# Patient Record
Sex: Female | Born: 1995 | Race: White | Hispanic: No | Marital: Single | Smoking: Never smoker
Health system: Southern US, Community
[De-identification: ages and names within clinical notes are randomized; demographics above are authoritative.]

## PROBLEM LIST (undated history)

## (undated) DIAGNOSIS — G43909 Migraine, unspecified, not intractable, without status migrainosus: Secondary | ICD-10-CM

## (undated) HISTORY — DX: Migraine, unspecified, not intractable, without status migrainosus: G43.909

## (undated) HISTORY — PX: WISDOM TOOTH EXTRACTION: SHX21

---
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
## 2014-02-07 ENCOUNTER — Encounter: Payer: Self-pay | Admitting: Neurology

## 2014-02-07 ENCOUNTER — Ambulatory Visit (INDEPENDENT_AMBULATORY_CARE_PROVIDER_SITE_OTHER): Payer: Managed Care, Other (non HMO) | Admitting: Neurology

## 2014-02-07 ENCOUNTER — Ambulatory Visit: Payer: Managed Care, Other (non HMO) | Admitting: Neurology

## 2014-02-07 VITALS — BP 100/70 | HR 76 | Ht 60.24 in | Wt 118.2 lb

## 2014-02-07 DIAGNOSIS — R42 Dizziness and giddiness: Secondary | ICD-10-CM

## 2014-02-07 DIAGNOSIS — R55 Syncope and collapse: Secondary | ICD-10-CM

## 2014-02-07 NOTE — Patient Instructions (Addendum)
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  Would likely benefit from a gently escalated physical activity program for graded reconditioning.  1.  Make all postural changes from lying to sitting or sitting to standing, slowly. 2.  Drink to 2.0 -2.5 L of fluids per day. 3.  Increase sodium in the diet to 3 - 5 g per day. 4.  Avoid large meals which can cause low blood pressure during digestion. It is better to eat smaller meals more often than three large meals. 5.  Avoid alcohol.  Alcohol and cause blood to pool in the legs which may worsen low blood pressure reactions when standing.  This can aggravate POTS. 6 .  Perform lower extremity exercises to improve strength of the leg muscles.  This will help prevent blood from a pooling in the legs when standing and walking. 7.  Raise the head of the bed by 6 to 10 inches.  The entire bed must be at an angle. Raising only the head portion of the bed at waist level or using pillows will not be effective.  Raising the head of the bed will reduce urine formation overnight and there will be more volume in the circulation in the morning. 8.  During bad days, drink 500 cc of water quickly.  This will result in an increased blood pressure within 5 minutes of drinking the water.  The effect will last up to one hour and may improve orthostatic intolerance. 9.  Use custom fitted elastic support stockings.  These will reduce a tendency for blood to pool in the legs when standing and may improve orthostatic intolerance. 10.  Use physical counter maneuvers such as leg crossing, squatting, or raising and resting the leg on a  chair.  These maneuvers increase blood pressure and can improve orthostatic intolerance.

## 2014-02-07 NOTE — Progress Notes (Signed)
Follow-up Visit   Date: 02/07/2014    Alton Revereaylor A Vallier MRN: [id number] DOB: 1996-01-28   Interim History: Alton Revereaylor A Birdwell is a 18 y.o. right-handed Caucasian female with history of migraines returning to the clinic for follow-up of lightheadedness.  The patient was accompanied to the clinic by mother who also provides collateral information.    History of present illness: She initially presented to the office on October 10, 2013 with abrupt change in vision.  She woke up feeling very lightheaded and and change is vision, described as seeing a small area with black around it ("tunnel vision"). It lasted about 10-15 seconds and since waking up today, it has occurred about 4-5 times always occuring with positional changes. She felt as if she could have passed out. She has had no further spells since being at the office. No associated headache, numbness/tingling, palpitations, excessive sweating, or weakness. She reports having a similar spell about 1 year ago.  She is very active in school Oncologist(cheerleader, sports, dance). She had not been working out more than usual and stays well-hydrated.   She reports having migraines which started in 4th grade and stopped a few years ago.   - Follow-up 02/07/2014: She continues to have dizziness and imbalance spells occuring when she gets up in the morning which lasts an hour and then resolves.  She has tunnel vision which lasts about 10 seconds.  She denies any twitching movement, confusion, or weakness.  She reports falling on one occasion without loss of consciousness.    Medications:  No current outpatient prescriptions on file prior to visit.   No current facility-administered medications on file prior to visit.    Allergies:  Allergies  Allergen Reactions  . Cephalosporins      Review of Systems:  CONSTITUTIONAL: No fevers, chills, night sweats, or weight loss.   EYES: No visual changes or eye pain ENT: No hearing changes.  No history of  nose bleeds.   RESPIRATORY: No cough, wheezing and shortness of breath.   CARDIOVASCULAR: Negative for chest pain, and palpitations.   GI: Negative for abdominal discomfort, blood in stools or black stools.  No recent change in bowel habits.   GU:  No history of incontinence.   MUSCLOSKELETAL: No history of joint pain or swelling.  No myalgias.   SKIN: Negative for lesions, rash, and itching.   ENDOCRINE: Negative for cold or heat intolerance, polydipsia or goiter.   PSYCH:  No depression or anxiety symptoms.   NEURO: As Above.   Vital Signs:  BP 100/70  Pulse 76  Ht 5' 0.24" (1.53 m)  Wt 118 lb 3 oz (53.609 kg)  BMI 22.90 kg/m2  SpO2 97%  Supine  100/68 84 Sitting  90/60 74 Standing 90/60 74  Neck:  No carotid bruit CV:  Regular rate and rhythm Pulm:  Clear to auscultateion  Neurological Exam: MENTAL STATUS including orientation to time, place, person, recent and remote memory, attention span and concentration, language, and fund of knowledge is normal.  Speech is not dysarthric.  CRANIAL NERVES: No visual field defects. Pupils equal round and reactive to light.  Normal conjugate, extra-ocular eye movements in all directions of gaze.  No ptosis. Normal facial sensation.  Face is symmetric. Palate elevates symmetrically.  Tongue is midline.  MOTOR:  Motor strength is 5/5 in all extremities.  No atrophy, fasciculations or abnormal movements.  No pronator drift.  Tone is normal.    MSRs:  Reflexes are 2+/4 throughout.  SENSORY:  Intact to light touch and vibration.  COORDINATION/GAIT:  Normal finger-to- nose-finger and heel-to-shin.  Intact rapid alternating movements bilaterally.  Gait narrow based and stable.    IMPRESSION/PLAN: Miss Katrinka BlazingSmith is an 18 year-old female returning to the clinic with episodic lightheadedness and tunnel-vision with positional changes. Exam remains nonfocal. 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 Orthostatic  blood pressure shows 10 point drop in BP.  I discussed that if symptoms do not improve with conservative measures of keeping well hydrated, tilt table test can be done to look for POTS as possible cause of her lightheadedness.  I have given her literature on strategies to minimize symptoms of orthostasis.  She will call the office in a month with a clinical update.    The duration of this appointment visit was 30 minutes of face-to-face time with the patient.  Greater than 50% of this time was spent in counseling, explanation of diagnosis, planning of further management, and coordination of care.   Thank you for allowing me to participate in patient's care.  If I can answer any additional questions, I would be pleased to do so.    Sincerely,    Betzabe Bevans K. Allena KatzPatel, DO

## 2017-07-11 ENCOUNTER — Ambulatory Visit (INDEPENDENT_AMBULATORY_CARE_PROVIDER_SITE_OTHER): Payer: No Typology Code available for payment source | Admitting: Neurology

## 2017-07-11 ENCOUNTER — Encounter: Payer: Self-pay | Admitting: Neurology

## 2017-07-11 ENCOUNTER — Encounter: Payer: Self-pay | Admitting: *Deleted

## 2017-07-11 VITALS — BP 110/70 | HR 68 | Ht 60.0 in | Wt 128.0 lb

## 2017-07-11 DIAGNOSIS — G43109 Migraine with aura, not intractable, without status migrainosus: Secondary | ICD-10-CM | POA: Insufficient documentation

## 2017-07-11 MED ORDER — FLURBIPROFEN 50 MG PO TABS: 50.0000 mg | ORAL_TABLET | Freq: Two times a day (BID) | ORAL | 2 refills | Status: DC

## 2017-07-11 NOTE — Patient Instructions (Addendum)
You can take magnesium oxide 400-600mg , excedrin migraine, or Ansaid  when you have visual changes   If your symptoms become more frequent, call my office

## 2017-07-11 NOTE — Progress Notes (Signed)
[facility] Clinic Note - Initial Visit   Date: 07/11/17  Jenna Pham MRN: [id number] DOB: November 15, 1995   Dear Dr. Uvaldo Rising:  Thank you for your kind referral of Jenna Pham for consultation of visual changes. Although her history is well known to you, please allow [place] to reiterate it for the purpose of our medical record. The patient was accompanied to the clinic by mother who also provides collateral information.     History of Present Illness: Jenna Pham is a 21 y.o. right-handed Caucasian female with migraines presenting for evaluation of vision changes.    Starting in August 2018, she started having visual changes described as if looking through sprayed windex. She was at work when he started and was unable to visualize the computer clearly to do her work. She has a blurry line across her central vision, with preserved peripheral vision.  She recalls having a bifrontal headaches which lasted 2-3 hours.  She felt nauseous and hot with this spell.  Since this time, she had two similar spells without headaches, lasting about 1-2 hours.  She denies any preceding warning.  She had dilated eye exam today which was normal and referred to see me for further evaluation.   She has history of migraines since 4th grade which occurs about once every 2-3 months. Pain is described as throbbing.  She has bright lights/dots which precede her pain.  She endorses photophobia, phonophobia, and some nausea.  She usually takes excedrin, if needed, which helps.  Rest usually helps.  Mother and brother also have migraines.   Past Medical History:  Diagnosis Date  . Migraine    Started in 4th grade    Past Surgical History:  Procedure Laterality Date  . WISDOM TOOTH EXTRACTION       Medications:  Outpatient Encounter Prescriptions as of 07/11/2017  Medication Sig  . JUNEL FE 1.5/30 1.5-30 MG-MCG tablet Take 1 tablet by mouth daily.  . flurbiprofen (ANSAID) 50 MG  tablet Take 1 tablet (50 mg total) by mouth 2 (two) times daily.   No facility-administered encounter medications on file as of 07/11/2017.      Allergies:  Allergies  Allergen Reactions  . Cephalosporins     Family History: Family History  Problem Relation Age of Onset  . Migraines Mother   . Healthy Father   . Migraines Brother     Social History: Social History  Substance Use Topics  . Smoking status: Never Smoker  . Smokeless tobacco: Never Used  . Alcohol use No   Social History   Social History Narrative   Lives with parents.  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      Review of Systems:  CONSTITUTIONAL: No fevers, chills, night sweats, or weight loss.   EYES: +visual changes or eye pain ENT: No hearing changes.  No history of nose bleeds.   RESPIRATORY: No cough, wheezing and shortness of breath.   CARDIOVASCULAR: Negative for chest pain, and palpitations.   GI: Negative for abdominal discomfort, blood in stools or black stools.  No recent change in bowel habits.   GU:  No history of incontinence.   MUSCLOSKELETAL: No history of joint pain or swelling.  No myalgias.   SKIN: Negative for lesions, rash, and itching.   HEMATOLOGY/ONCOLOGY: Negative for prolonged bleeding, bruising easily, and swollen nodes.  No history of cancer.   ENDOCRINE: Negative for cold or heat intolerance, polydipsia or goiter.  PSYCH:  No depression or anxiety symptoms.   NEURO: As Above.   Vital Signs:  BP 110/70   Pulse 68   Ht 5' (1.524 m)   Wt 128 lb (58.1 kg)   SpO2 93%   BMI 25.00 kg/m  Pain Scale: 0 on a scale of 0-10   General Medical Exam:   General:  Well appearing, comfortable.   Eyes/ENT: see cranial nerve examination.   Neck: No masses appreciated.  Full range of motion without tenderness.  No carotid bruits. Respiratory:  Clear to auscultation, good air entry bilaterally.   Cardiac:  Regular rate and rhythm, no murmur.    Extremities:  No deformities, edema, or skin discoloration.  Skin:  No rashes or lesions.  Neurological Exam: MENTAL STATUS including orientation to time, place, person, recent and remote memory, attention span and concentration, language, and fund of knowledge is normal.  Speech is not dysarthric.  CRANIAL NERVES: II:  No visual field defects.  Unremarkable fundi.   III-IV-VI: pharmacologically dilated pupils, unreactive.  Normal conjugate, extra-ocular eye movements in all directions of gaze.  No nystagmus.  No ptosis.   V:  Normal facial sensation.   VII:  Normal facial symmetry and movements.   VIII:  Normal hearing and vestibular function.   IX-X:  Normal palatal movement.   XI:  Normal shoulder shrug and head rotation.   XII:  Normal tongue strength and range of motion, no deviation or fasciculation.  MOTOR:  Motor strength is 5/5 throughout.  No atrophy, fasciculations or abnormal movements.  No pronator drift.  Tone is normal.    MSRs:  Reflexes are 2+/4 throughout.  SENSORY:  Normal and symmetric perception of light touch, pinprick, vibration, and proprioception.     COORDINATION/GAIT: Normal finger-to- nose-finger.  Intact rapid alternating movements bilaterally.  Gait narrow based and stable. Tandem and stressed gait intact.    IMPRESSION: [name] is a 21 year-old female referred for evaluation of vision distortion.  Her neurological exam is entirely normal and non-focal.  She also had normal eye evaluation earlier today. 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  Because these are episodic, occuring once per month, I recommend treating with NSAIDs as needed.  She was given a prescription for Ansaid  which she can take in combination with magnesium oxide 400-600mg .  Reassurance provided. No need for intracranial imaging in the setting of normal exam and no worrisome features on her history.  Driving  safety discussed and she was encouraged not to drive during episode of ocular migraine.   Return to clinic as needed   The duration of this appointment visit was 35 minutes of face-to-face time with the patient.  Greater than 50% of this time was spent in counseling, explanation of diagnosis, planning of further management, and coordination of care.   Thank you for allowing me to participate in patient's care.  If I can answer any additional questions, I would be pleased to do so.    Sincerely,    Shae Hinnenkamp K. Allena Katz, DO

## 2017-07-13 ENCOUNTER — Telehealth: Payer: Self-pay | Admitting: Neurology

## 2017-07-13 MED ORDER — SUMATRIPTAN SUCCINATE 50 MG PO TABS: 50.0000 mg | ORAL_TABLET | ORAL | 3 refills | Status: AC | PRN

## 2017-07-13 NOTE — Telephone Encounter (Signed)
Patient's mom given instructions.

## 2017-07-13 NOTE — Telephone Encounter (Signed)
Noted  

## 2017-07-13 NOTE — Telephone Encounter (Signed)
Patient mother called and state the medication is no longer available please call her

## 2017-07-13 NOTE — Telephone Encounter (Signed)
Okay to discontinue Ansaid due to unavailability and expense.  For severe migraine with headache, start Imitrex 50 mg at headache onset. Repeat in 2 hours if necessary.  Rx sent.  For strictly ocular migraines without pain, use Excedrin migraine or Aleve.  Nira Visscher K. Allena KatzPatel, DO

## 2017-07-17 ENCOUNTER — Telehealth: Payer: Self-pay | Admitting: Neurology

## 2017-07-17 NOTE — Telephone Encounter (Signed)
Pharmacy [facility]) called needing to speak with you regarding changing a medication due to the expense of it and it being on back order. Please Call. Thanks

## 2017-07-17 NOTE — Telephone Encounter (Signed)
Informed pharmacy that we changed med to sumatriptan.

## 2017-07-31 ENCOUNTER — Other Ambulatory Visit: Payer: Self-pay | Admitting: *Deleted

## 2017-07-31 ENCOUNTER — Ambulatory Visit: Payer: Managed Care, Other (non HMO) | Admitting: Neurology

## 2017-07-31 ENCOUNTER — Telehealth: Payer: Self-pay | Admitting: Neurology

## 2017-07-31 DIAGNOSIS — I639 Cerebral infarction, unspecified: Secondary | ICD-10-CM

## 2017-07-31 DIAGNOSIS — G43611 Persistent migraine aura with cerebral infarction, intractable, with status migrainosus: Secondary | ICD-10-CM

## 2017-07-31 NOTE — Telephone Encounter (Signed)
Order placed

## 2017-07-31 NOTE — Telephone Encounter (Signed)
Jenna Pham wants to know if she can get a CT scan of the head she is worried about if there is something going on with her. She has had a migraine since seeing Dr Allena KatzPatel. She would like it during winter break which will be dec 16th to Jan 15th  Please call her and let her know

## 2017-07-31 NOTE — Telephone Encounter (Signed)
Please advise 

## 2017-07-31 NOTE — Telephone Encounter (Signed)
Please order MRI brain wo contrast.

## 2017-08-21 ENCOUNTER — Other Ambulatory Visit: Payer: Self-pay | Admitting: *Deleted

## 2017-08-21 DIAGNOSIS — G43109 Migraine with aura, not intractable, without status migrainosus: Secondary | ICD-10-CM

## 2017-09-28 ENCOUNTER — Other Ambulatory Visit: Payer: No Typology Code available for payment source

## 2017-09-28 ENCOUNTER — Ambulatory Visit
Admission: RE | Admit: 2017-09-28 | Discharge: 2017-09-28 | Disposition: A | Discharge status: Home / Self Care | Payer: No Typology Code available for payment source | Source: Ambulatory Visit | Attending: Neurology | Admitting: Neurology

## 2017-09-28 DIAGNOSIS — G43109 Migraine with aura, not intractable, without status migrainosus: Secondary | ICD-10-CM

## 2017-09-29 ENCOUNTER — Telehealth: Payer: Self-pay | Admitting: *Deleted

## 2017-09-29 NOTE — Telephone Encounter (Signed)
Patient notified

## 2017-09-29 NOTE — Telephone Encounter (Signed)
-----   Message from Glendale Chardonika K Patel, DO sent at 09/28/2017  2:54 PM EST ----- Please inform patient that MRI is normal and does not show anything worrisome. Thanks

## 2019-03-25 IMAGING — MR MR HEAD W/O CM
8 series · 48 of 48 positions shown · non-contrast
Comparison: None.

CLINICAL DATA: Migraine headaches since the age of 3. Symptoms
worsening with visual disturbance.

EXAM:
MRI HEAD WITHOUT CONTRAST
TECHNIQUE: Multiplanar, multiecho pulse sequences of the brain and surrounding
structures were obtained without intravenous contrast.

[Series 3: T1 · sagittal · 5.0mm · 0.45mm/px · 1 of 19 slices shown]
[im 1/19]
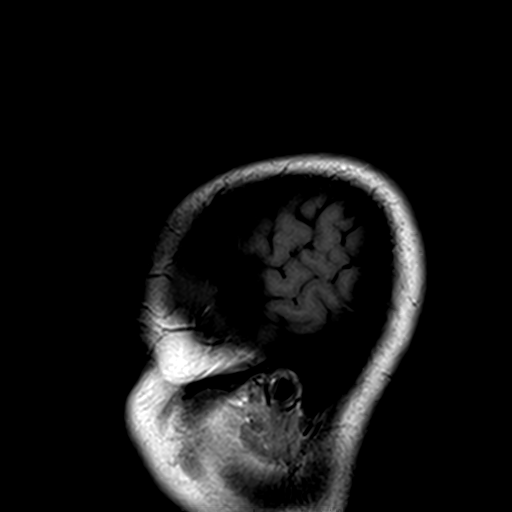

[Series 4: T2 · axial · 5.0mm · 0.51mm/px · z∈[-153,-15]mm · 3 of 24 slices shown (1 of 2)]
[im 1/24]
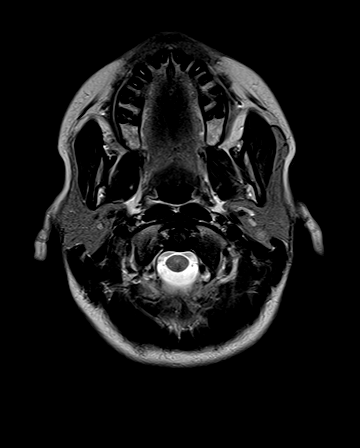
[im 12/24]
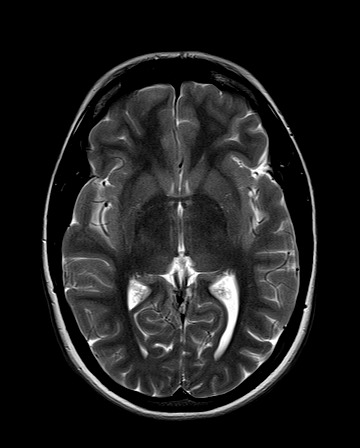
[im 24/24]
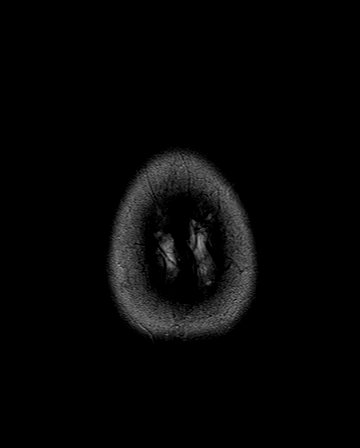

[Series 5: DWI · axial · 3.0mm · 1.80mm/px · z∈[-155,-19]mm · 10 of 96 slices shown (1 of 2)]
[im 1/96]
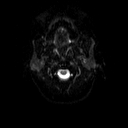
[im 11/96]
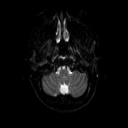
[im 22/96]
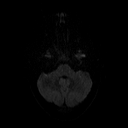
[im 32/96]
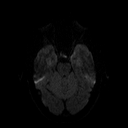
[im 43/96]
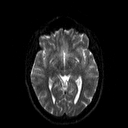
[im 53/96]
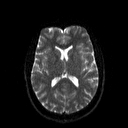
[im 64/96]
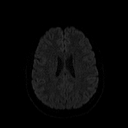
[im 74/96]
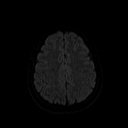
[im 85/96]
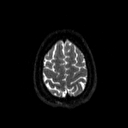
[im 96/96]
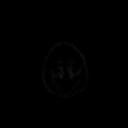

[Series 6: DWI · axial · 3.0mm · 1.80mm/px · z∈[-155,-19]mm · 5 of 48 slices shown (2 of 2)]
[im 1/48]
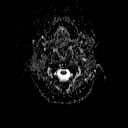
[im 12/48]
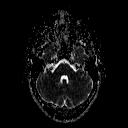
[im 24/48]
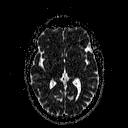
[im 36/48]
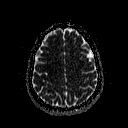
[im 48/48]
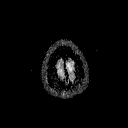

[Series 8: swi_images · axial · 2.0mm · 0.90mm/px · z∈[-151,-15]mm · 8 of 72 slices shown]
[im 1/72]
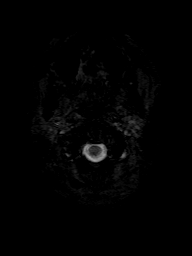
[im 11/72]
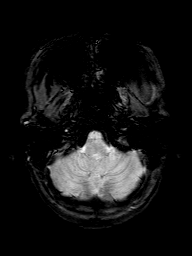
[im 21/72]
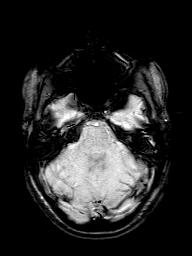
[im 31/72]
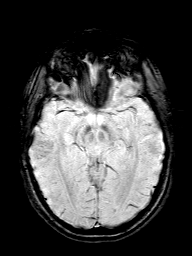
[im 41/72]
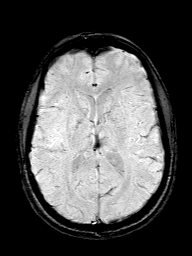
[im 51/72]
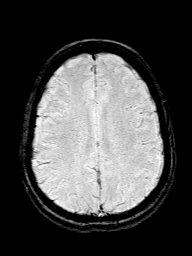
[im 61/72]
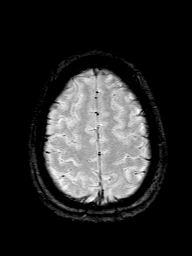
[im 72/72]
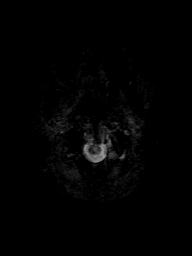

[Series 9: FLAIR · axial · 3.0mm · 0.45mm/px · z∈[-156,-18]mm · 3 of 30 slices shown]
[im 1/30]
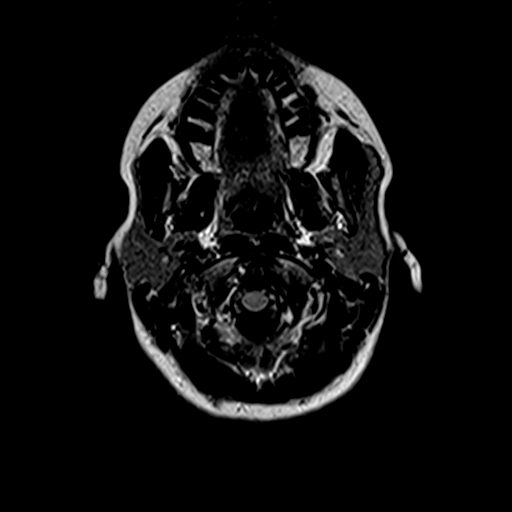
[im 15/30]
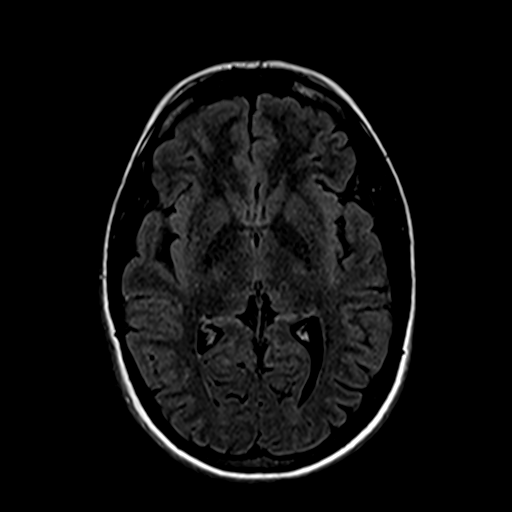
[im 30/30]
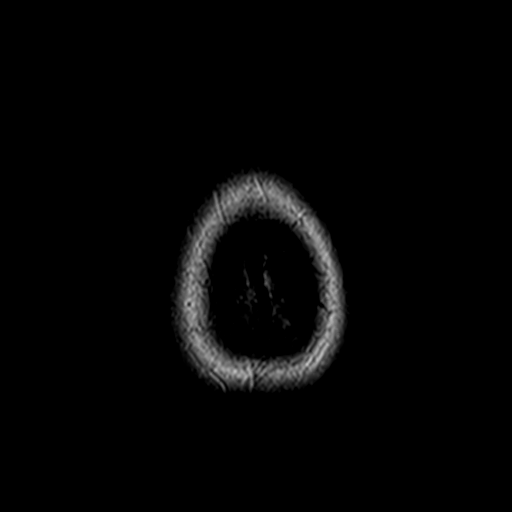

[Series 10: t1_mpr_tra · axial · 1.0mm · 0.75mm/px · z∈[-157,-12]mm · 15 of 144 slices shown]
[im 1/144]
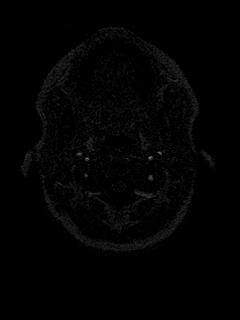
[im 11/144]
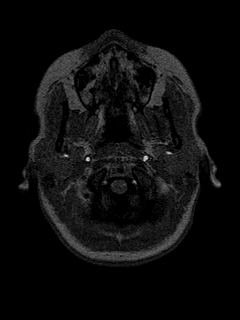
[im 21/144]
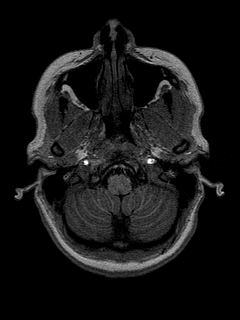
[im 31/144]
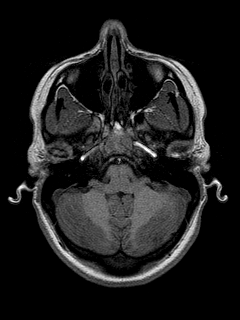
[im 41/144]
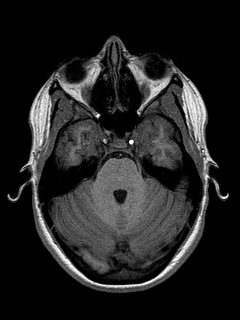
[im 52/144]
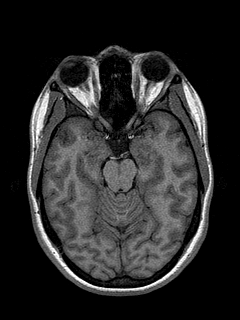
[im 62/144]
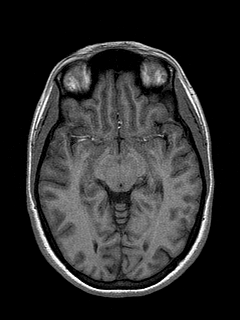
[im 72/144]
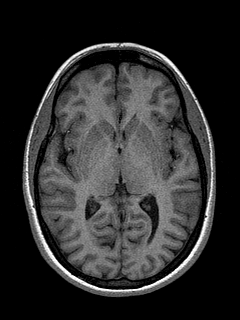
[im 82/144]
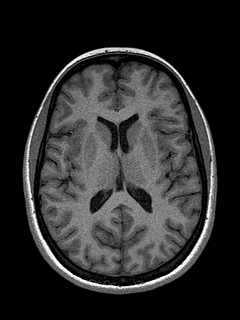
[im 92/144]
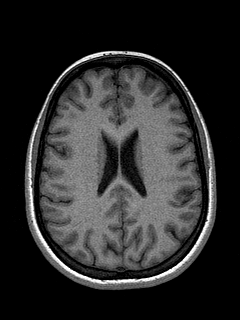
[im 103/144]
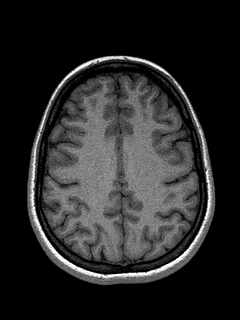
[im 113/144]
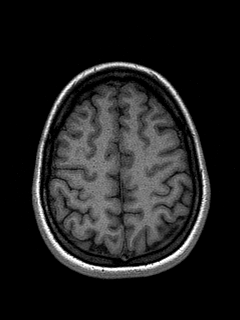
[im 123/144]
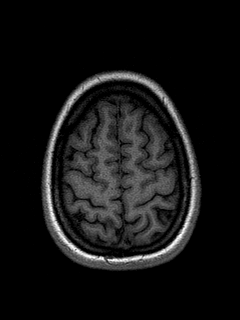
[im 133/144]
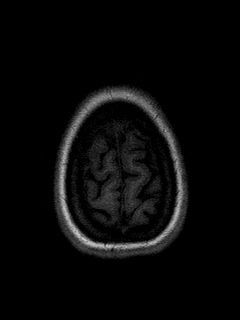
[im 144/144]
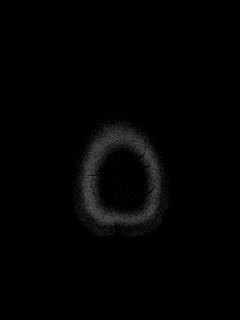

[Series 11: T2 · coronal · 5.0mm · 0.45mm/px · 3 of 26 slices shown (2 of 2)]
[im 1/26]
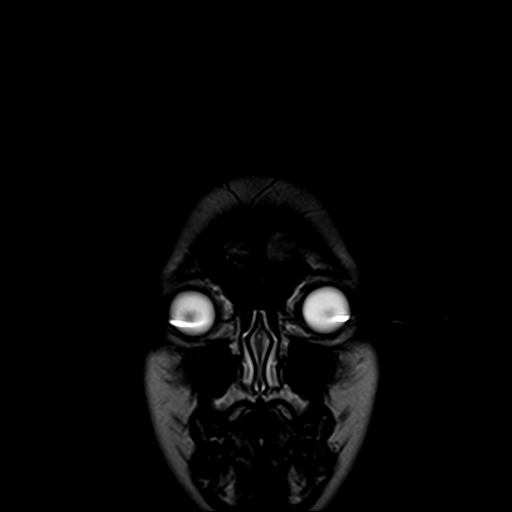
[im 13/26]
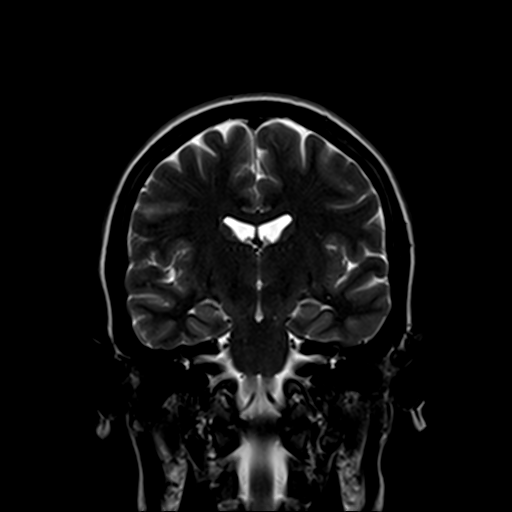
[im 26/26]
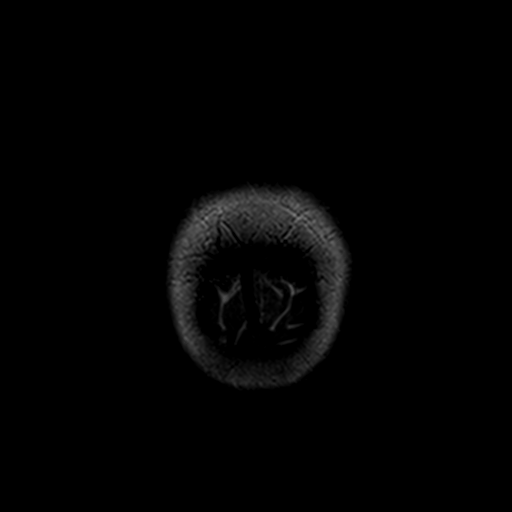

[48 of 48 positions shown; findings below may reference images not displayed]

FINDINGS: Brain: The brain has normal appearance without evidence of
malformation, atrophy, old or acute small or large vessel
infarction, hemorrhage, hydrocephalus or extra-axial collection. No
pituitary abnormality.

Vascular: Major vessels at the base of the brain show flow.

Skull and upper cervical spine: Normal

Sinuses/Orbits: Clear/ normal.

Other: None significant.
IMPRESSION: Normal examination. No cause or sequela of migraine headache is
identified.
